# Patient Record
Sex: Female | Born: 1960 | Race: White | Hispanic: No | Marital: Married
Health system: Southern US, Community
[De-identification: ages and names within clinical notes are randomized; demographics above are authoritative.]

---

## 1999-03-12 ENCOUNTER — Other Ambulatory Visit: Admission: RE | Admit: 1999-03-12 | Discharge: 1999-03-12 | Payer: Self-pay | Admitting: *Deleted

## 2000-04-10 ENCOUNTER — Other Ambulatory Visit: Admission: RE | Admit: 2000-04-10 | Discharge: 2000-04-10 | Payer: Self-pay | Admitting: *Deleted

## 2003-02-27 ENCOUNTER — Encounter (INDEPENDENT_AMBULATORY_CARE_PROVIDER_SITE_OTHER): Payer: Self-pay | Admitting: Specialist

## 2003-02-27 ENCOUNTER — Observation Stay (HOSPITAL_COMMUNITY): Admission: RE | Admit: 2003-02-27 | Discharge: 2003-02-28 | Payer: Self-pay | Admitting: *Deleted

## 2003-06-30 ENCOUNTER — Emergency Department (HOSPITAL_COMMUNITY): Admission: EM | Admit: 2003-06-30 | Discharge: 2003-06-30 | Payer: Self-pay | Admitting: Emergency Medicine

## 2004-02-23 ENCOUNTER — Other Ambulatory Visit: Admission: RE | Admit: 2004-02-23 | Discharge: 2004-02-23 | Payer: Self-pay | Admitting: *Deleted

## 2004-11-13 IMAGING — CT CT ABDOMEN W/O CM
1 of 4 series · 15 of 32 positions shown, 19 images · non-contrast
Comparison: none

CLINICAL DATA: Right lower quadrant pain.  
 CT OF THE ABDOMEN WITHOUT CONTRAST ? 06/30/03
 CT of the abdomen was performed without contrast tailored to evaluate for renal calculi.
 The gallbladder is surgically absent.  A tiny distal right ureteral calculus is appreciated with mild right ureteral dilatation.  On the left, there is mild pelviectasis.  The left ureter is nondilated.  No renal calculi are appreciated.  
 The lung bases are clear.  The portions of the liver and spleen visualized have a normal appearance.  The pancreas is unremarkable.  The large and small bowel have a normal appearance.  The appendix is normal.
 IMPRESSION
 Tiny distal right ureteral calculus resulting in mild ureteral dilatation.
 CT OF THE PELVIS WITHOUT CONTRAST ? 06/30/03 
 Helical imaging without contrast.
 The sigmoid colon and rectum are unremarkable.  Negative for adnexal masses.  There is no free fluid.
 CT pelvis unremarkable.

[Series 5: — · axial · 0.77mm/px · z∈[+697,+1077]mm · 15 of 105 slices shown, 19 images]
[im 5/105  soft-tissue]
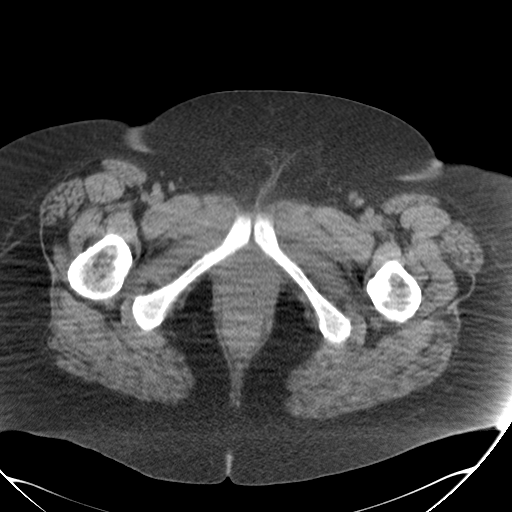
[im 5/105  bone]
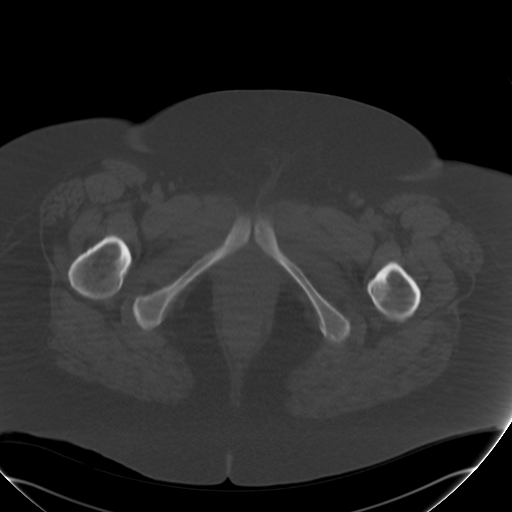
[im 13/105  soft-tissue]
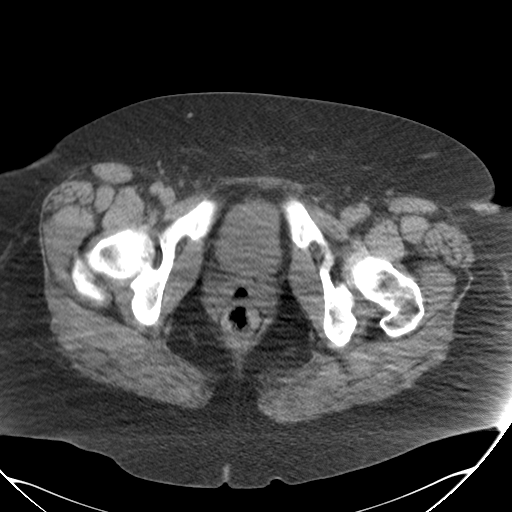
[im 21/105  soft-tissue]
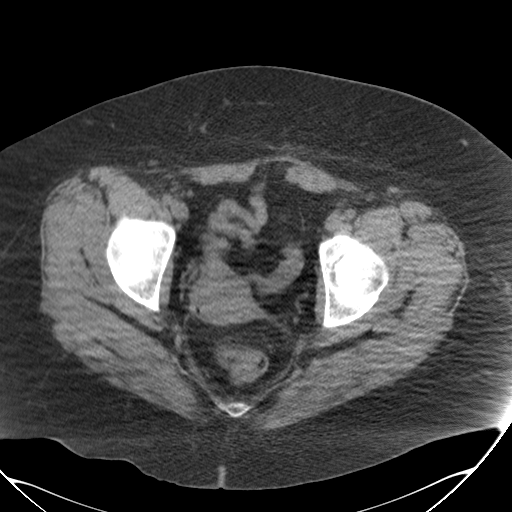
[im 30/105  soft-tissue]
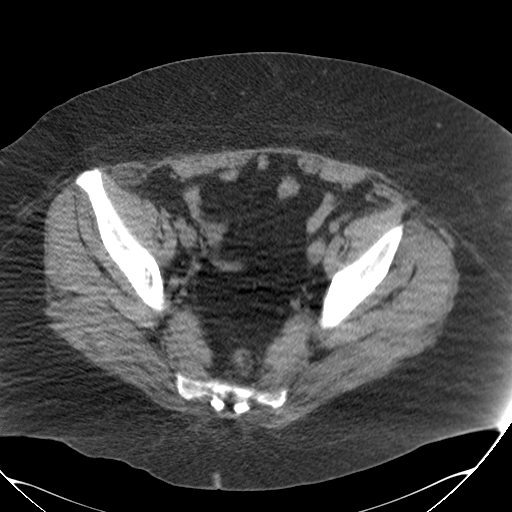
[im 38/105  soft-tissue]
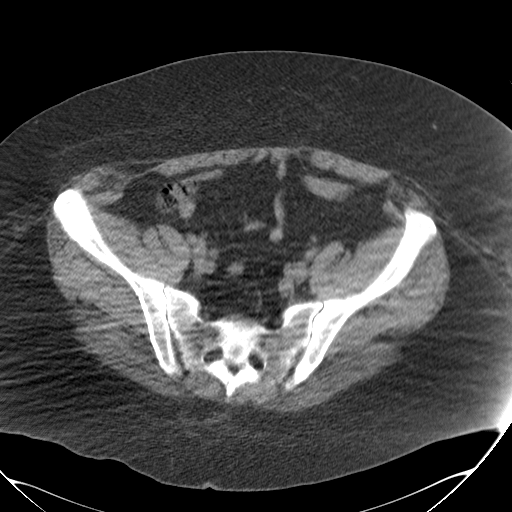
[im 46/105  soft-tissue]
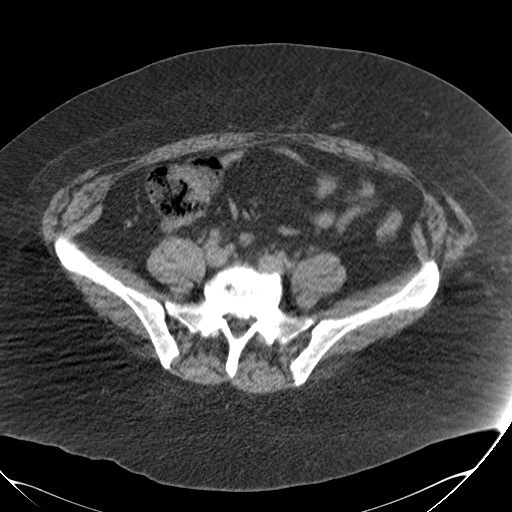
[im 55/105  soft-tissue]
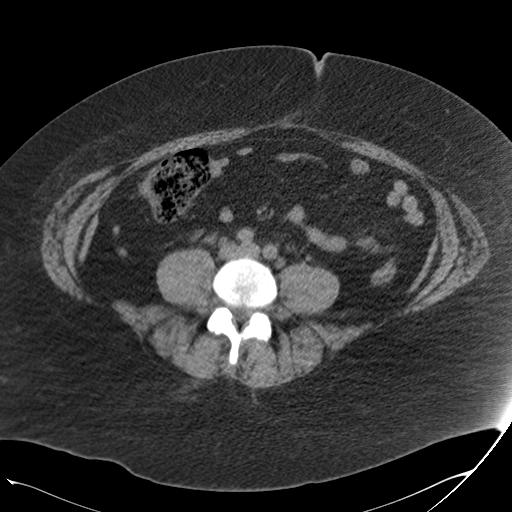
[im 59/105  soft-tissue]
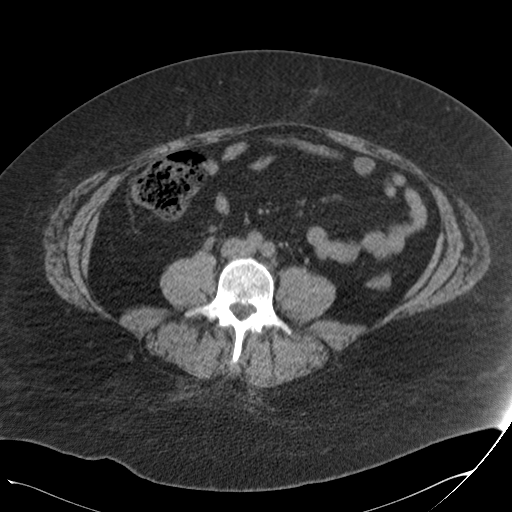
[im 67/105  soft-tissue]
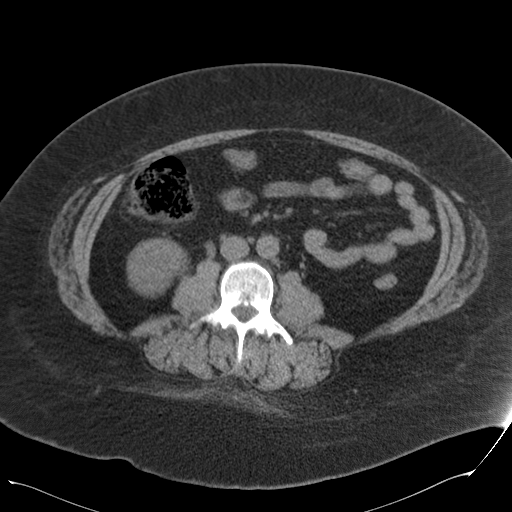
[im 67/105  bone]
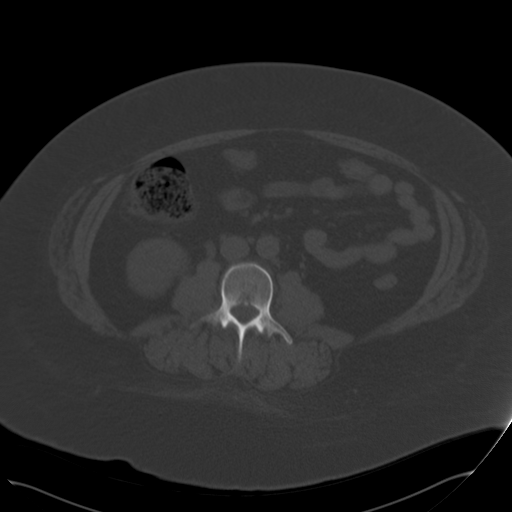
[im 75/105  soft-tissue]
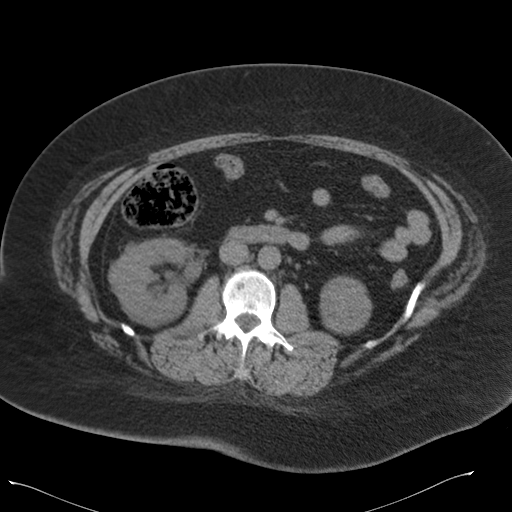
[im 84/105  soft-tissue]
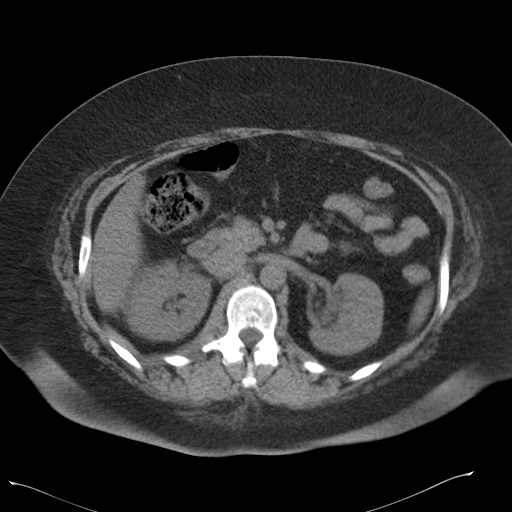
[im 88/105  lung]
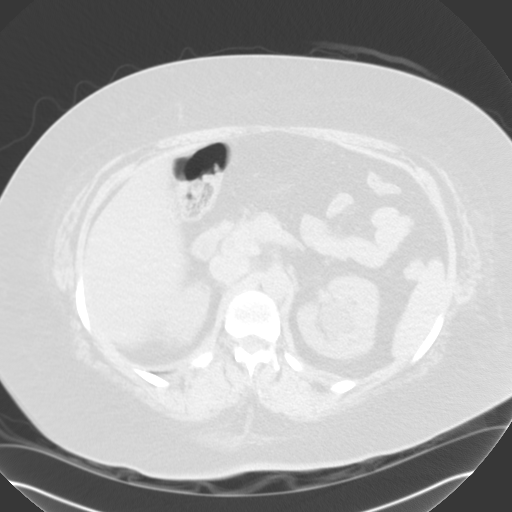
[im 92/105  soft-tissue]
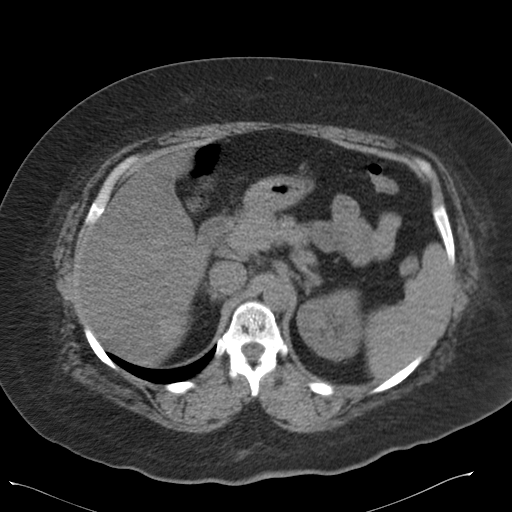
[im 92/105  lung]
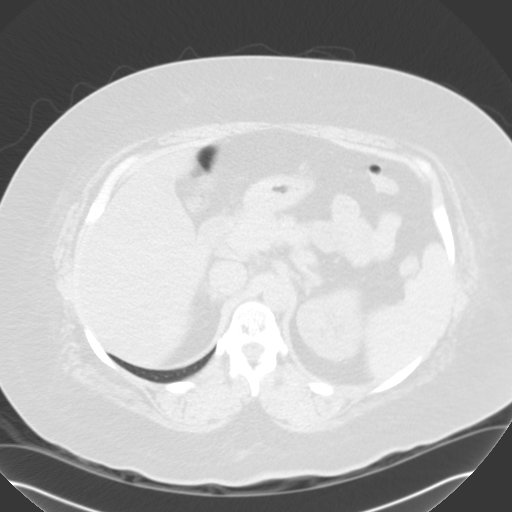
[im 96/105  lung]
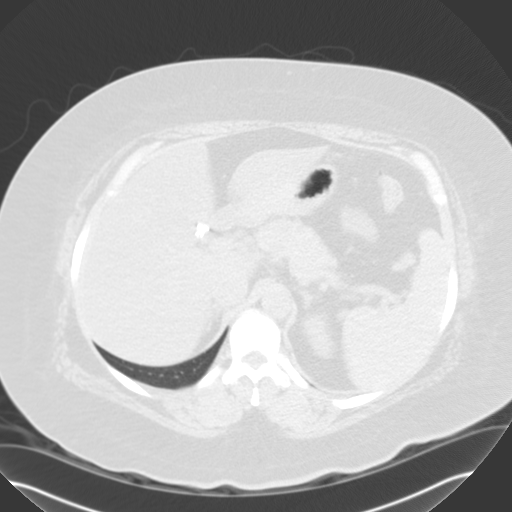
[im 100/105  soft-tissue]
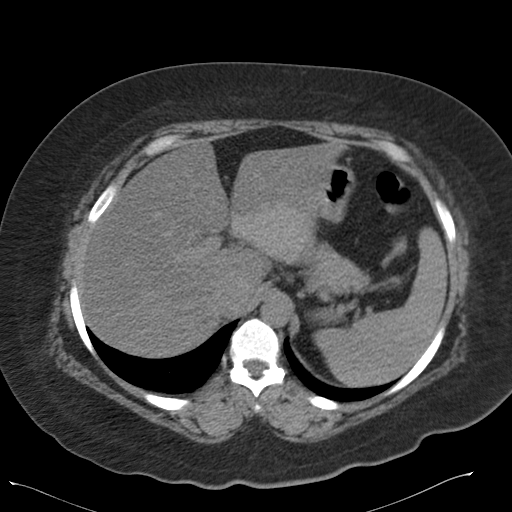
[im 100/105  lung]
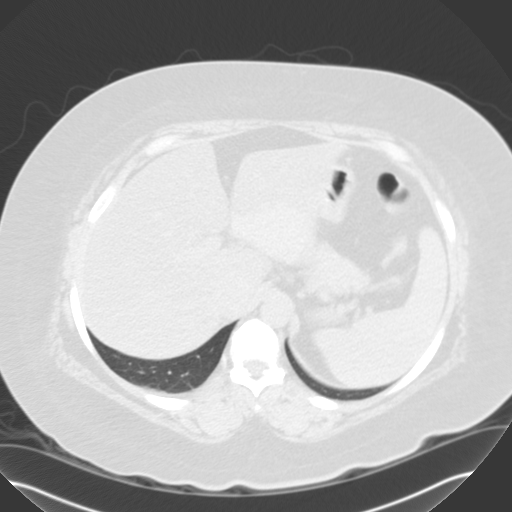

[15 of 32 positions shown; findings below may reference images not displayed]

## 2007-05-29 ENCOUNTER — Other Ambulatory Visit: Admission: RE | Admit: 2007-05-29 | Discharge: 2007-05-29 | Payer: Self-pay | Admitting: *Deleted

## 2010-10-04 ENCOUNTER — Emergency Department (HOSPITAL_COMMUNITY)
Admission: EM | Admit: 2010-10-04 | Discharge: 2010-10-04 | Disposition: A | Discharge status: Home / Self Care | Payer: BC Managed Care – PPO | Attending: Emergency Medicine | Admitting: Emergency Medicine

## 2010-10-04 ENCOUNTER — Emergency Department (HOSPITAL_COMMUNITY): Payer: BC Managed Care – PPO

## 2010-10-04 DIAGNOSIS — R109 Unspecified abdominal pain: Secondary | ICD-10-CM | POA: Insufficient documentation

## 2010-10-04 DIAGNOSIS — K7689 Other specified diseases of liver: Secondary | ICD-10-CM | POA: Insufficient documentation

## 2010-10-04 DIAGNOSIS — Z9089 Acquired absence of other organs: Secondary | ICD-10-CM | POA: Insufficient documentation

## 2010-10-04 DIAGNOSIS — N201 Calculus of ureter: Secondary | ICD-10-CM | POA: Insufficient documentation

## 2010-10-04 LAB — URINE MICROSCOPIC-ADD ON

## 2010-10-04 LAB — URINALYSIS, ROUTINE W REFLEX MICROSCOPIC
Glucose, UA: NEGATIVE mg/dL
Ketones, ur: NEGATIVE mg/dL
Leukocytes, UA: NEGATIVE
Nitrite: NEGATIVE
Protein, ur: NEGATIVE mg/dL
Specific Gravity, Urine: 1.028 (ref 1.005–1.030)
Urobilinogen, UA: 0.2 mg/dL (ref 0.0–1.0)
pH: 5.5 (ref 5.0–8.0)

## 2010-10-04 LAB — POCT PREGNANCY, URINE: Preg Test, Ur: NEGATIVE

## 2010-10-22 NOTE — Discharge Summary (Signed)
   NAMEMALEYAH, Tina Gillespie                           ACCOUNT NO.:  [id number]   MEDICAL RECORD NO.:  [id number]                   PATIENT TYPE:  OBV   LOCATION:  0480                                 FACILITY:  [facility]   PHYSICIAN:  Almedia Balls. Fore, M.D.                DATE OF BIRTH:  [birth year]   DATE OF ADMISSION:  02/27/2003  DATE OF DISCHARGE:  02/28/2003                                 DISCHARGE SUMMARY   HISTORY:  The patient is a 50 year old with uterine enlargement and probable  left ureteral compression with left hydronephrosis, for hysterectomy on  February 27, 2003.  The remainder of her history and physical are as  previously dictated.   LABORATORY DATA:  Preoperative hemoglobin 14.5.   HOSPITAL COURSE:  The patient was taken to the operating room on the morning  of February 27, 2003, at which time abdominal supracervical hysterectomy,  lysis of adhesions were performed.  The patient did well postoperatively.  Diet and ambulation were progressed over the evening of September 23 and  early morning of February 28, 2003.  On the morning of February 28, 2003,  she was afebrile and experiencing no problems, and it was felt that she  could be discharged at this time.   FINAL DIAGNOSES:  1. Uterine enlargement.  2. Pelvic pain.  3. Probable left ureteral compression with left hydronephrosis.  4. Pelvic adhesions.   OPERATIONS:  1. Abdominal supracervical hysterectomy.  2. Lysis of adhesions.   PATHOLOGY REPORT:  Not available at the time of dictation.   DISPOSITION:  Discharged home, to return to the office in two weeks for  follow-up.  She was instructed to gradually progress her activities over  several weeks at home and to limit lifting or driving for two weeks.  She  was fully ambulatory, on a regular diet, and in good condition at the time  of discharge.  She was given prescription for [facility] generic #30 to  be taken 1 or 2 q.4h. p.r.n. pain and doxycycline  100 mg #12 to be taken 1  b.i.d.                                               Almedia Balls. [name], M.D.    SRF/MEDQ  D:  02/28/2003  T:  02/28/2003  Job:  [id number]

## 2010-10-22 NOTE — Op Note (Signed)
Tina Gillespie, Tina Gillespie                           ACCOUNT NO.:  [id number]   MEDICAL RECORD NO.:  [id number]                   Gillespie TYPE:  AMB   LOCATION:  DAY                                  FACILITY:  [facility]   PHYSICIAN:  Almedia Balls. Fore, M.D.                DATE OF BIRTH:  07-Jun-1960   DATE OF PROCEDURE:  02/27/2003  DATE OF DISCHARGE:                                 OPERATIVE REPORT   PREOPERATIVE DIAGNOSES:  1. 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  2. Left ureteral compression with moderate left hydronephrosis.   POSTOPERATIVE DIAGNOSES:  1. 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  2. Left ureteral compression with moderate left hydronephrosis.  3. Pending pathology.   OPERATION/PROCEDURE:  Abdominal supracervical hysterectomy.   ANESTHESIA:  General orotracheal.   SURGEON:  Almedia Balls. Tina Gillespie, M.D.   FIRST ASSISTANT:  Leona Singleton, M.D.   INDICATIONS:  The Gillespie is a 50 year old with the above-noted problems who  has been counseled as to the need for surgery to treat these problems and  the type of surgery to be performed and the risks involved to include risks  of anesthesia, injury to bowel, bladder, blood vessels, ureters,  postoperative hemorrhage, infection, recuperation and possible hormone  replacement should her ovaries be removed.  She fully understands all of  these considerations and wishes to proceed on February 27, 2003.   FINDINGS:  On entry into the abdomen, the uterus was enlarged to  approximately [redacted] weeks gestational size with the multiple myomata present.  There were corpus luteum cysts present on both ovaries.  There were dense  adhesions involving the anterior lower uterine segment and bladder.   DESCRIPTION OF PROCEDURE:  With the Gillespie under general anesthesia, she  was prepped and draped in the usual sterile fashion with the Foley catheter  in the bladder, the lower abdominal transverse incision was made after  excising the previous  transverse surgical scar.  The incision was carried  into the peritoneal cavity without difficulty.  Self-retaining retractor was  placed and the bowel was packed off.  Kelly clamps were placed at the  uterine-ovarian anastomoses, tubes and restless leg syndrome bilaterally for  traction and hemostasis.  Transection of the round ligaments bilaterally was  accomplished using Bovie electrocoagulation with entry into the  retroperitoneal space and lysis of the adhesions anteriorly with development  of a bladder flap.  Because of the normal appearance of the ovaries, it was  felt that they should be conserved.  Heaney clamps were placed across the  uterine-ovarian anastomoses bilaterally; these structures were cut and  suture ligated with #1 chromic catgut.  Uterine vessels were skeletonized  bilaterally, clamped, cut, and suture ligated with #1 chromic catgut.  It  was impossible to excise the uterine fundus for greater technical ease in  the remainder of the surgery.  The bladder was taken down further on the  lower uterine  segment and cervix, and Heaney clamps were used on the  cardinal ligaments bilaterally; these structures were cut free and suture  ligated with #1 chromic catgut.  The remaining lower uterine segment and  endocervix were then removed using Bovie electrocoagulation.  Cervical stump  was left in place and rendered hemostatic and reapproximated with  interrupted figure-of-eight sutures of #1 chromic catgut.  The area was  lavaged with copious amounts of lactated Ringer's solution and after noting  that hemostasis was maintained and that sponge and instrument counts were  correct, the peritoneum was closed with continuous suture of 0 Vicryl.  Fascia was closed with two sutures of 0 Vicryl which were brought from the  lateral aspects of the incision and tied separately in the midline.  Subcutaneous fat was reapproximated with interrupted sutures of 0 Vicryl.  Skin was closed  with subcuticular suture of 3-0 plain catgut.  Estimated  blood loss - 400 mL.  The Gillespie was taken to the recovery room in good  condition with clear urine in the Foley catheter tubing.   She will be placed on 23-hour observation following surgery.                                                    Almedia Balls. Tina Gillespie, M.D.    SRF/MEDQ  D:  02/27/2003  T:  02/27/2003  Job:  [id number]   cc:   Leona Singleton, M.D.  [address]  [place]  [place] [address]  Fax: [phone]

## 2010-10-22 NOTE — H&P (Signed)
NAMECINDEL, Tina Gillespie                           ACCOUNT NO.:  [id number]   MEDICAL RECORD NO.:  [id number]                   PATIENT TYPE:  AMB   LOCATION:  DAY                                  FACILITY:  [facility]   PHYSICIAN:  Almedia Balls. Fore, M.D.                DATE OF BIRTH:  1961/05/28   DATE OF ADMISSION:  02/27/2003  DATE OF DISCHARGE:                                HISTORY & PHYSICAL   CHIEF COMPLAINT:  Fibroids, heavy bleeding.   HISTORY:  The patient is a 50 year old gravida 1, para 1, whose last  menstrual period was February 12, 2003.  She was seen in our office on  January 30, 2003, initially to be evaluated for fibroids and probable  compression of ureter by a posterior fibroid.  She had been referred by Dr.  Leona Singleton for these problems.  He had found that she did have a large  uterus and ordered an ultrasound which showed that the patient did have two  probable fibroids with a moderately hydronephrotic left kidney and a normal  right kidney.  The suspicion was that of distal ureteral obstruction because  of the fibroids.  She is admitted at this time for abdominal hysterectomy,  possible bilateral salpingo-oophorectomy.  Pap smear was normal in July  2004.  The patient has been fully counseled as to the nature of the  procedure and the risks involved to include risk of anesthesia, injury to  bowel, bladder, blood vessels, ureters, postoperative hemorrhage, infection,  recuperation, and use of hormones should her ovaries be removed.  She fully  understands all of these considerations and wishes to proceed on February 27, 2003.   MEDICATIONS:  Use of oral contraceptives in an unsuccessful attempt to  moderate her menses.  She also takes ibuprofen for pain without success.   ALLERGIES:  CIPRO IV, as stated by the patient.   PAST SURGICAL HISTORY:  1. She had a cholecystectomy in 1993 without sequelae.  2. Cesarean section in 1989.   FAMILY HISTORY:  Includes  grandmother with diabetes.  Father with heart  attack.   REVIEW OF SYSTEMS:  HEENT:  Wears glasses.  CARDIORESPIRATORY:  Negative.  GASTROINTESTINAL:  Negative except for some diarrhea on a fairly frequent  basis, etiology undetermined.  GENITOURINARY:  As in present illness.  NEUROMUSCULAR:  Negative.   PHYSICAL EXAMINATION:  VITAL SIGNS:  Height 5 feet 7 inches, weight 296  pounds, blood pressure 124/78, pulse 88, respirations 18.  GENERAL:  A well-developed white female in no acute distress.  HEENT:  Within normal limits.  NECK:  Supple without masses, adenopathy, or bruits.  HEART:  Regular rate and rhythm without murmurs.  LUNGS:  Clear to P&A.  BREASTS:  Sitting and laying without mass.  Axillae negative.  ABDOMEN:  Soft.  Mass effect in the lower abdomen to just below the  umbilicus.  There  is an increased panniculus present, and the mass is  somewhat tender.  PELVIC:  External genitalia, Bartholin's, urethra, and Skene's glands within  normal limits.  Vagina is clean.  Cervix slightly inflamed.  Uterus mid  position, approximately [redacted] weeks gestational size and irregular and somewhat  tender.  Adnexal structures are not palpated.  Anterior posterior cul-de-sac  exam is confirmatory.  EXTREMITIES:  Within normal limits.  CENTRAL NERVOUS SYSTEM:  Grossly intact.  SKIN:  Without suspicious lesions.   IMPRESSION:  Uterine fibroids, possible ureteral compression.   DISPOSITION:  As noted above.                                                Almedia Balls. [name], M.D.    SRF/MEDQ  D:  02/20/2003  T:  02/20/2003  Job:  [id number]

## 2021-08-10 ENCOUNTER — Telehealth: Payer: Self-pay

## 2021-08-10 NOTE — Telephone Encounter (Signed)
I have tried to call patient regarding recall colonoscopy. Neither phone number worked.  ?

## 2021-08-30 ENCOUNTER — Encounter: Payer: Self-pay | Admitting: Gastroenterology
